# Patient Record
Sex: Female | Born: 1965 | Race: White | Hispanic: No | Smoking: Current every day smoker
Health system: Southern US, Community
[De-identification: ages and names within clinical notes are randomized; demographics above are authoritative.]

## PROBLEM LIST (undated history)

## (undated) HISTORY — PX: ORTHOPEDIC SURGERY: SHX850

---

## 2008-12-26 ENCOUNTER — Emergency Department (HOSPITAL_COMMUNITY): Admission: EM | Admit: 2008-12-26 | Discharge: 2008-12-26 | Payer: Self-pay | Admitting: Emergency Medicine

## 2009-01-04 ENCOUNTER — Other Ambulatory Visit: Admission: RE | Admit: 2009-01-04 | Discharge: 2009-01-04 | Payer: Self-pay | Admitting: Obstetrics & Gynecology

## 2010-08-27 ENCOUNTER — Institutional Professional Consult (permissible substitution) (INDEPENDENT_AMBULATORY_CARE_PROVIDER_SITE_OTHER): Payer: Self-pay | Admitting: Internal Medicine

## 2010-10-28 LAB — URINALYSIS, ROUTINE W REFLEX MICROSCOPIC
Bilirubin Urine: NEGATIVE
Glucose, UA: NEGATIVE mg/dL
Ketones, ur: 15 mg/dL — AB
Protein, ur: NEGATIVE mg/dL
pH: 5.5 (ref 5.0–8.0)

## 2010-10-28 LAB — CBC
HCT: 37.3 % (ref 36.0–46.0)
MCHC: 35.1 g/dL (ref 30.0–36.0)
MCV: 96.1 fL (ref 78.0–100.0)
Platelets: 185 10*3/uL (ref 150–400)

## 2010-10-28 LAB — DIFFERENTIAL
Basophils Relative: 1 % (ref 0–1)
Eosinophils Absolute: 0.3 10*3/uL (ref 0.0–0.7)
Eosinophils Relative: 4 % (ref 0–5)
Lymphs Abs: 3.3 10*3/uL (ref 0.7–4.0)
Monocytes Relative: 6 % (ref 3–12)
Neutrophils Relative %: 46 % (ref 43–77)

## 2010-10-28 LAB — WET PREP, GENITAL
WBC, Wet Prep HPF POC: NONE SEEN
Yeast Wet Prep HPF POC: NONE SEEN

## 2010-10-28 LAB — URINE MICROSCOPIC-ADD ON

## 2016-06-24 ENCOUNTER — Other Ambulatory Visit: Payer: Self-pay | Admitting: *Deleted

## 2016-06-24 MED ORDER — CITALOPRAM HYDROBROMIDE 20 MG PO TABS: 20.0000 mg | ORAL_TABLET | Freq: Every day | ORAL | 3 refills | Status: AC

## 2016-12-17 ENCOUNTER — Other Ambulatory Visit: Payer: Self-pay | Admitting: Physician Assistant

## 2017-02-10 ENCOUNTER — Ambulatory Visit (INDEPENDENT_AMBULATORY_CARE_PROVIDER_SITE_OTHER): Payer: BLUE CROSS/BLUE SHIELD | Admitting: Urology

## 2017-02-10 ENCOUNTER — Other Ambulatory Visit (HOSPITAL_COMMUNITY)
Admission: RE | Admit: 2017-02-10 | Discharge: 2017-02-10 | Disposition: A | Discharge status: Home / Self Care | Payer: BLUE CROSS/BLUE SHIELD | Source: Ambulatory Visit | Attending: Urology | Admitting: Urology

## 2017-02-10 DIAGNOSIS — R310 Gross hematuria: Secondary | ICD-10-CM

## 2017-02-13 ENCOUNTER — Other Ambulatory Visit: Payer: Self-pay | Admitting: Urology

## 2017-02-13 DIAGNOSIS — R310 Gross hematuria: Secondary | ICD-10-CM

## 2017-02-19 ENCOUNTER — Other Ambulatory Visit: Payer: Self-pay | Admitting: Physician Assistant

## 2017-02-20 ENCOUNTER — Encounter (HOSPITAL_COMMUNITY): Payer: Self-pay | Admitting: Radiology

## 2017-02-20 ENCOUNTER — Ambulatory Visit (HOSPITAL_COMMUNITY)
Admission: RE | Admit: 2017-02-20 | Discharge: 2017-02-20 | Disposition: A | Discharge status: Home / Self Care | Payer: BLUE CROSS/BLUE SHIELD | Source: Ambulatory Visit | Attending: Urology | Admitting: Urology

## 2017-02-20 DIAGNOSIS — N200 Calculus of kidney: Secondary | ICD-10-CM | POA: Insufficient documentation

## 2017-02-20 DIAGNOSIS — R310 Gross hematuria: Secondary | ICD-10-CM | POA: Insufficient documentation

## 2017-02-20 DIAGNOSIS — N281 Cyst of kidney, acquired: Secondary | ICD-10-CM | POA: Insufficient documentation

## 2017-02-20 DIAGNOSIS — D1809 Hemangioma of other sites: Secondary | ICD-10-CM | POA: Diagnosis not present

## 2017-02-20 DIAGNOSIS — I700 Atherosclerosis of aorta: Secondary | ICD-10-CM | POA: Diagnosis not present

## 2017-02-20 MED ORDER — IOPAMIDOL (ISOVUE-300) INJECTION 61%
125.0000 mL | Freq: Once | INTRAVENOUS | Status: AC | PRN
  Administered 2017-02-20: 125 mL via INTRAVENOUS

## 2017-02-20 MED ORDER — SODIUM CHLORIDE 0.9 % IV SOLN
INTRAVENOUS | Status: AC
  Filled 2017-02-20: qty 250

## 2017-06-03 ENCOUNTER — Ambulatory Visit: Payer: BLUE CROSS/BLUE SHIELD | Admitting: Urology

## 2018-10-08 ENCOUNTER — Ambulatory Visit: Payer: Self-pay

## 2019-01-17 IMAGING — CT CT ABD-PEL WO/W CM
2 of 11 series · 10 of 46 positions shown, 16 images · IV contrast (Isovue)
Comparison: None.

CLINICAL DATA: Intermittent hematuria, painless x4 months

EXAM:
CT ABDOMEN AND PELVIS WITHOUT AND WITH CONTRAST
TECHNIQUE: Multidetector CT imaging of the abdomen and pelvis was performed
following the standard protocol before and following the bolus
administration of intravenous contrast.
CONTRAST:  125mL [id number] IOPAMIDOL ([id number]-CFF) INJECTION 61%

[Series 5: axial delay · axial · delayed · 0.71mm/px · z∈[+718,+1068]mm · 8 of 90 slices shown, 13 images]
[im 10/90  soft-tissue]
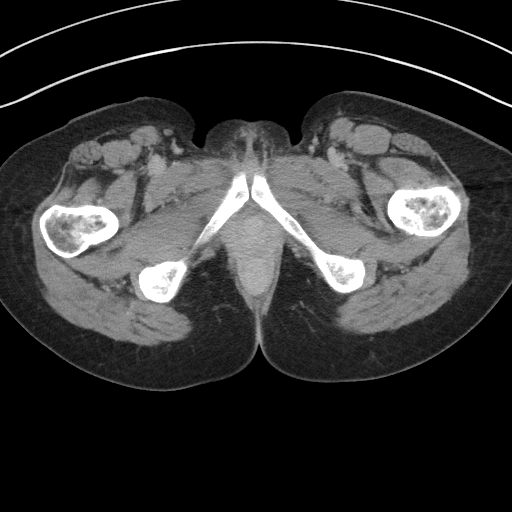
[im 10/90  bone]
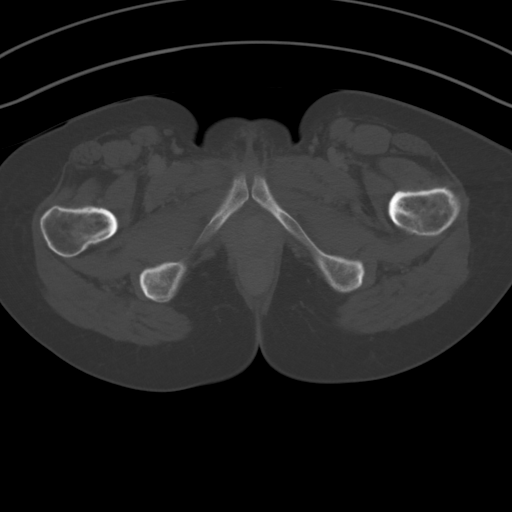
[im 20/90  soft-tissue]
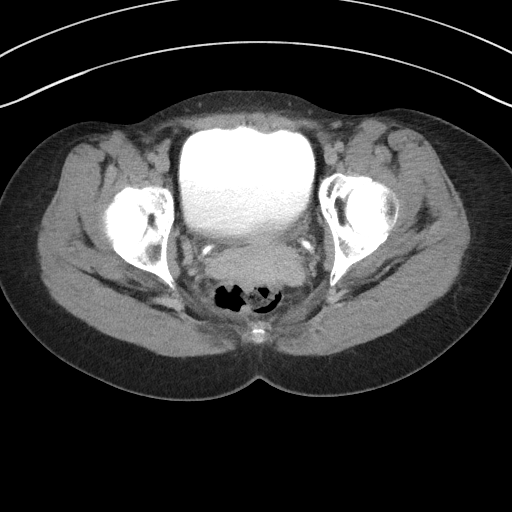
[im 30/90  soft-tissue]
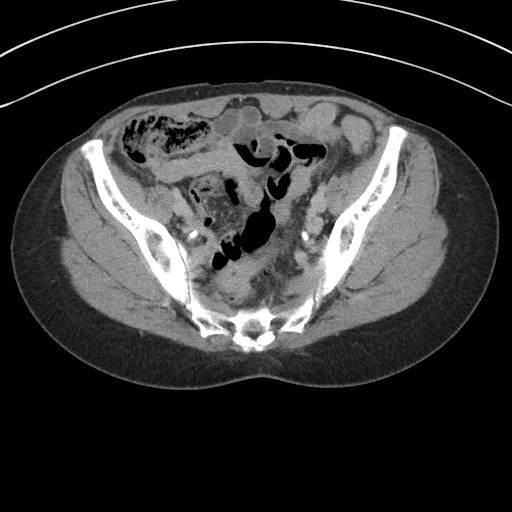
[im 40/90  soft-tissue]
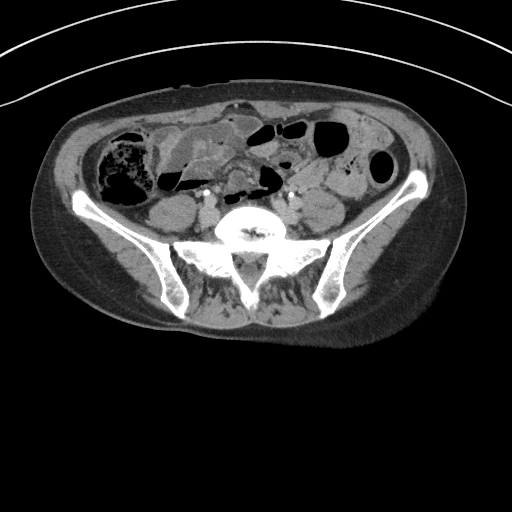
[im 50/90  soft-tissue]
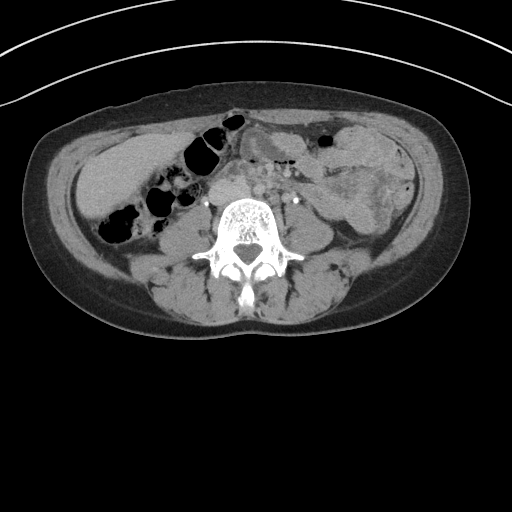
[im 50/90  lung]
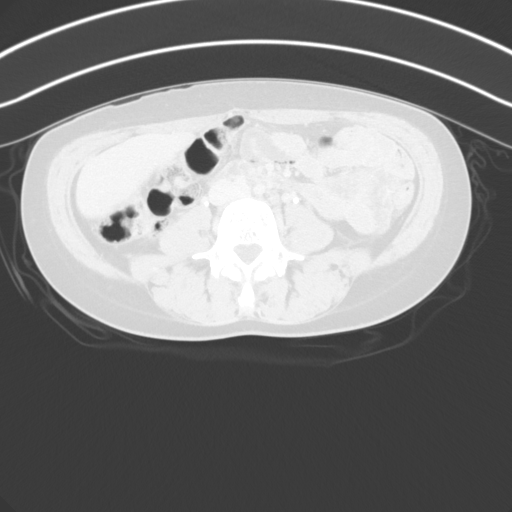
[im 60/90  soft-tissue]
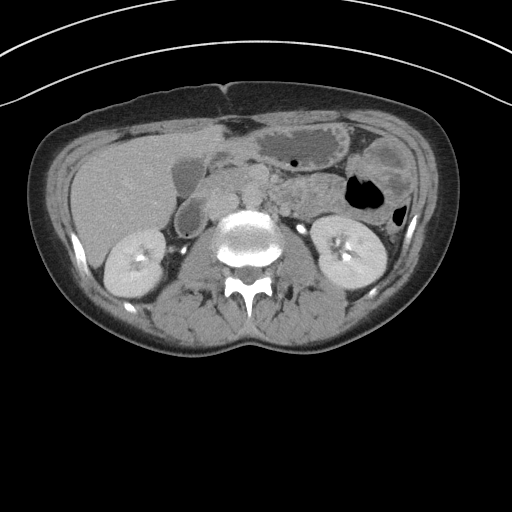
[im 60/90  lung]
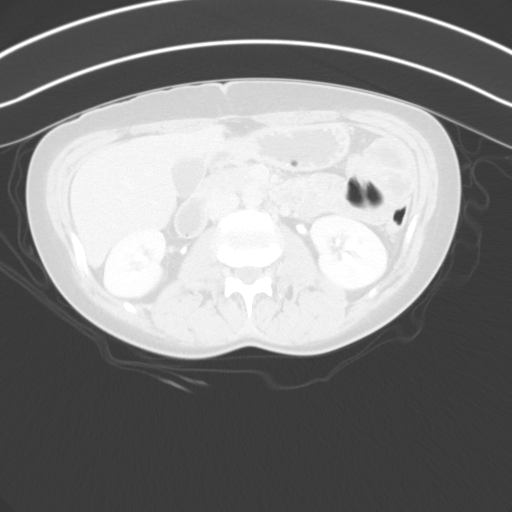
[im 70/90  soft-tissue]
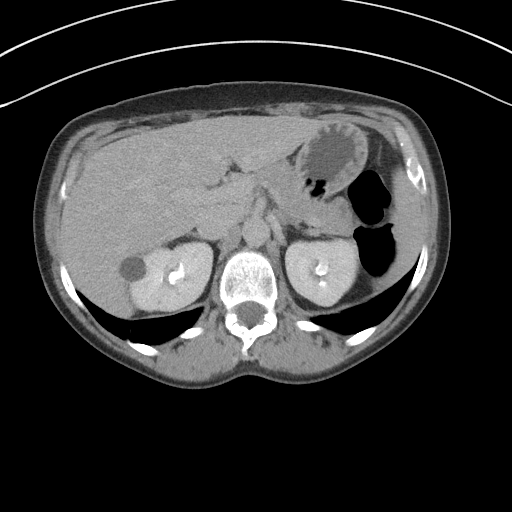
[im 70/90  lung]
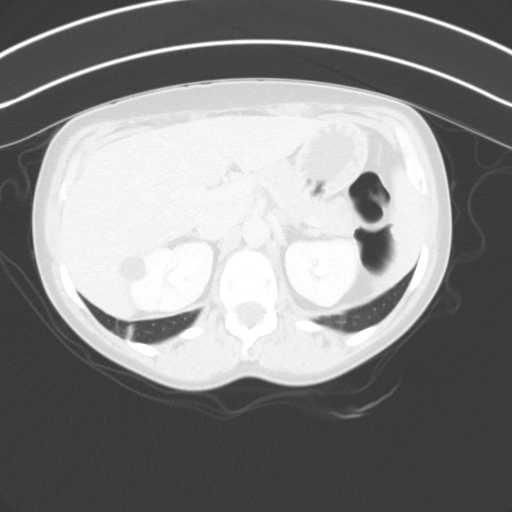
[im 80/90  soft-tissue]
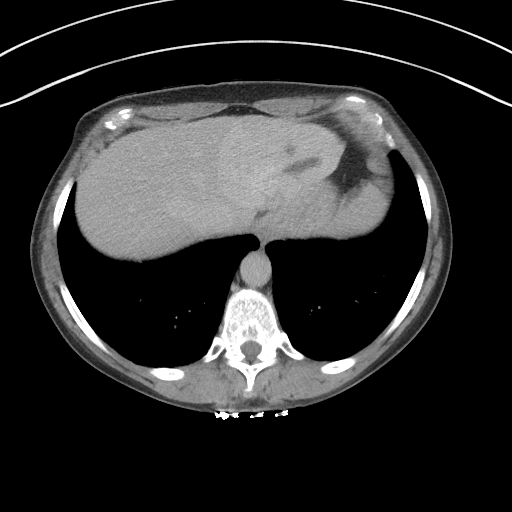
[im 80/90  lung]
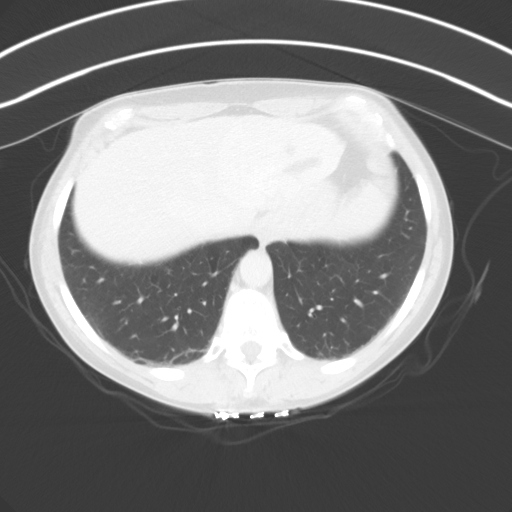

[Series 12: coronal pre · coronal · non-contrast · 0.65mm/px · 2 of 93 slices shown, 3 images]
[im 31/93  soft-tissue]
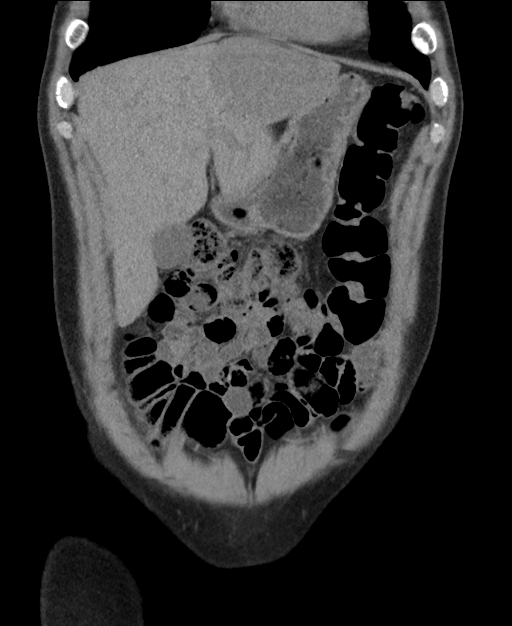
[im 31/93  bone]
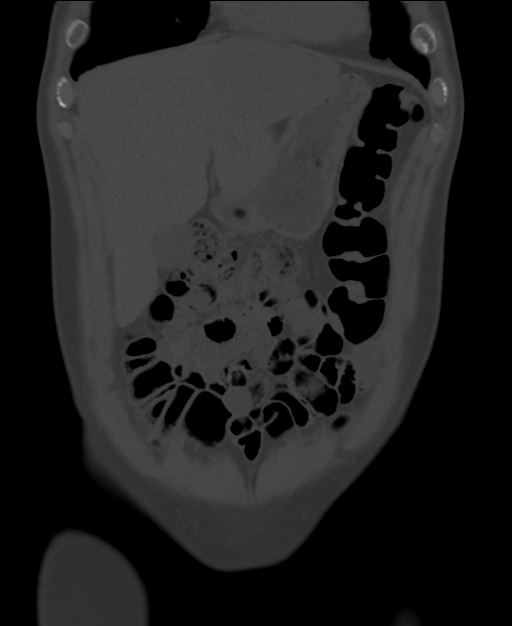
[im 62/93  soft-tissue]
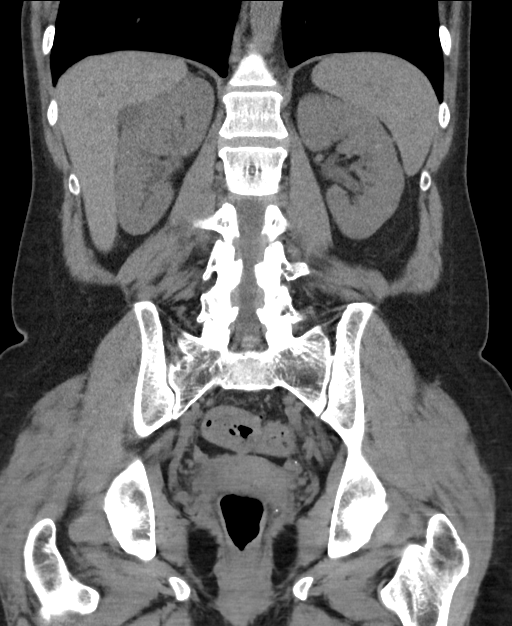

[10 of 46 positions shown; findings below may reference images not displayed]

FINDINGS: Lower chest: Lung bases are clear.

Hepatobiliary: 8.7 cm cavernous hemangioma in the lateral segment
left hepatic lobe (series 4/image 10).

Gallbladder is unremarkable. No intrahepatic or extrahepatic ductal
dilatation.

Pancreas: Within normal limits.

Spleen: Within normal limits.

Adrenals/Urinary Tract: Adrenal glands are within normal limits.

1.8 cm lateral right upper pole renal cyst (series 4/ image 17). No
enhancing renal lesions.

5 mm nonobstructing left lower pole renal calculus (series 3/ image
26). No ureteral or bladder calculi. No hydronephrosis.

Mildly thick-walled bladder.

Stomach/Bowel: Stomach is within normal limits.

No evidence of bowel obstruction.

Normal appendix (series 4/ image 60).

Vascular/Lymphatic: No evidence of abdominal aortic aneurysm.

Mild atherosclerotic calcifications the abdominal aorta.

No suspicious abdominopelvic lymphadenopathy.

Reproductive: Uterus is within normal limits.

No adnexal masses.

Other: No abdominopelvic ascites.

Musculoskeletal: Visualized osseous structures are within normal
limits.
IMPRESSION: 5 mm nonobstructing left lower pole renal calculus. No ureteral or
bladder calculi. No hydronephrosis.

1.8 cm right lateral right upper pole renal cyst, benign (Bosniak
I). No enhancing renal lesions.

8.7 cm cavernous hemangioma in the lateral segment left hepatic
lobe, benign.

## 2019-07-27 ENCOUNTER — Other Ambulatory Visit: Payer: Self-pay

## 2019-07-27 ENCOUNTER — Ambulatory Visit: Payer: Self-pay | Attending: Internal Medicine

## 2019-07-27 DIAGNOSIS — Z20822 Contact with and (suspected) exposure to covid-19: Secondary | ICD-10-CM | POA: Insufficient documentation

## 2019-07-28 ENCOUNTER — Telehealth: Payer: Self-pay

## 2019-07-28 LAB — NOVEL CORONAVIRUS, NAA: SARS-CoV-2, NAA: NOT DETECTED

## 2019-07-28 NOTE — Telephone Encounter (Signed)
Pt. Checking on COVID 19 results, not available yet. °

## 2019-11-19 ENCOUNTER — Encounter (HOSPITAL_COMMUNITY): Payer: Self-pay | Admitting: Emergency Medicine

## 2019-11-19 ENCOUNTER — Other Ambulatory Visit: Payer: Self-pay

## 2019-11-19 ENCOUNTER — Emergency Department (HOSPITAL_COMMUNITY)
Admission: EM | Admit: 2019-11-19 | Discharge: 2019-11-19 | Disposition: A | Discharge status: Home / Self Care | Payer: Self-pay | Attending: Emergency Medicine | Admitting: Emergency Medicine

## 2019-11-19 ENCOUNTER — Emergency Department (HOSPITAL_COMMUNITY): Payer: Self-pay

## 2019-11-19 DIAGNOSIS — R112 Nausea with vomiting, unspecified: Secondary | ICD-10-CM | POA: Insufficient documentation

## 2019-11-19 DIAGNOSIS — Z79899 Other long term (current) drug therapy: Secondary | ICD-10-CM | POA: Insufficient documentation

## 2019-11-19 DIAGNOSIS — R079 Chest pain, unspecified: Secondary | ICD-10-CM | POA: Insufficient documentation

## 2019-11-19 DIAGNOSIS — F17210 Nicotine dependence, cigarettes, uncomplicated: Secondary | ICD-10-CM | POA: Insufficient documentation

## 2019-11-19 DIAGNOSIS — F1210 Cannabis abuse, uncomplicated: Secondary | ICD-10-CM | POA: Insufficient documentation

## 2019-11-19 DIAGNOSIS — N3001 Acute cystitis with hematuria: Secondary | ICD-10-CM | POA: Insufficient documentation

## 2019-11-19 LAB — URINALYSIS, ROUTINE W REFLEX MICROSCOPIC
Bilirubin Urine: NEGATIVE
Glucose, UA: NEGATIVE mg/dL
Ketones, ur: 5 mg/dL — AB
Nitrite: NEGATIVE
Protein, ur: 30 mg/dL — AB
Specific Gravity, Urine: 1.021 (ref 1.005–1.030)
pH: 5 (ref 5.0–8.0)

## 2019-11-19 LAB — COMPREHENSIVE METABOLIC PANEL
ALT: 22 U/L (ref 0–44)
AST: 25 U/L (ref 15–41)
Albumin: 4.6 g/dL (ref 3.5–5.0)
Alkaline Phosphatase: 83 U/L (ref 38–126)
Anion gap: 13 (ref 5–15)
BUN: 17 mg/dL (ref 6–20)
CO2: 24 mmol/L (ref 22–32)
Calcium: 9.4 mg/dL (ref 8.9–10.3)
Chloride: 102 mmol/L (ref 98–111)
Creatinine, Ser: 0.73 mg/dL (ref 0.44–1.00)
GFR calc Af Amer: >60 mL/min (ref >60)
GFR calc non Af Amer: >60 mL/min (ref >60)
Glucose, Bld: 100 mg/dL — ABNORMAL HIGH (ref 70–99)
Potassium: 3.6 mmol/L (ref 3.5–5.1)
Sodium: 139 mmol/L (ref 135–145)
Total Bilirubin: 0.5 mg/dL (ref 0.3–1.2)
Total Protein: 7.7 g/dL (ref 6.5–8.1)

## 2019-11-19 LAB — POC OCCULT BLOOD, ED: Fecal Occult Bld: NEGATIVE

## 2019-11-19 LAB — TROPONIN I (HIGH SENSITIVITY)
Troponin I (High Sensitivity): 3 ng/L (ref <18)
Troponin I (High Sensitivity): 3 ng/L (ref <18)

## 2019-11-19 LAB — CBC WITH DIFFERENTIAL/PLATELET
Abs Immature Granulocytes: 0.03 10*3/uL (ref 0.00–0.07)
Basophils Absolute: 0 10*3/uL (ref 0.0–0.1)
Basophils Relative: 0 %
Eosinophils Absolute: 0.2 10*3/uL (ref 0.0–0.5)
Eosinophils Relative: 1 %
HCT: 40.2 % (ref 36.0–46.0)
Hemoglobin: 13.6 g/dL (ref 12.0–15.0)
Immature Granulocytes: 0 %
Lymphocytes Relative: 39 %
Lymphs Abs: 4.3 10*3/uL — ABNORMAL HIGH (ref 0.7–4.0)
MCH: 32.7 pg (ref 26.0–34.0)
MCHC: 33.8 g/dL (ref 30.0–36.0)
MCV: 96.6 fL (ref 80.0–100.0)
Monocytes Absolute: 0.6 10*3/uL (ref 0.1–1.0)
Monocytes Relative: 6 %
Neutro Abs: 5.8 10*3/uL (ref 1.7–7.7)
Neutrophils Relative %: 54 %
Platelets: 286 10*3/uL (ref 150–400)
RBC: 4.16 MIL/uL (ref 3.87–5.11)
RDW: 12.8 % (ref 11.5–15.5)
WBC: 10.9 10*3/uL — ABNORMAL HIGH (ref 4.0–10.5)
nRBC: 0 % (ref 0.0–0.2)

## 2019-11-19 LAB — RAPID URINE DRUG SCREEN, HOSP PERFORMED
Amphetamines: NOT DETECTED
Barbiturates: NOT DETECTED
Benzodiazepines: NOT DETECTED
Cocaine: NOT DETECTED
Opiates: NOT DETECTED
Tetrahydrocannabinol: POSITIVE — AB

## 2019-11-19 LAB — LIPASE, BLOOD: Lipase: 32 U/L (ref 11–51)

## 2019-11-19 MED ORDER — ONDANSETRON HCL 4 MG/2ML IJ SOLN
4.0000 mg | Freq: Once | INTRAMUSCULAR | Status: AC
  Administered 2019-11-19: 4 mg via INTRAVENOUS
  Filled 2019-11-19: qty 2

## 2019-11-19 MED ORDER — ONDANSETRON 4 MG PO TBDP
4.0000 mg | ORAL_TABLET | Freq: Three times a day (TID) | ORAL | 0 refills | Status: AC | PRN
Start: 2019-11-19 — End: ?

## 2019-11-19 MED ORDER — LORAZEPAM 2 MG/ML IJ SOLN
1.0000 mg | Freq: Once | INTRAMUSCULAR | Status: AC
  Administered 2019-11-19: 1 mg via INTRAVENOUS
  Filled 2019-11-19: qty 1

## 2019-11-19 MED ORDER — PROMETHAZINE HCL 25 MG/ML IJ SOLN
12.5000 mg | Freq: Once | INTRAMUSCULAR | Status: AC
  Administered 2019-11-19: 15:00:00 12.5 mg via INTRAVENOUS
  Filled 2019-11-19: qty 1

## 2019-11-19 MED ORDER — SODIUM CHLORIDE 0.9 % IV BOLUS
1000.0000 mL | Freq: Once | INTRAVENOUS | Status: AC
  Administered 2019-11-19: 1000 mL via INTRAVENOUS

## 2019-11-19 MED ORDER — PANTOPRAZOLE SODIUM 40 MG IV SOLR
40.0000 mg | Freq: Once | INTRAVENOUS | Status: AC
  Administered 2019-11-19: 40 mg via INTRAVENOUS
  Filled 2019-11-19: qty 40

## 2019-11-19 MED ORDER — CEPHALEXIN 500 MG PO CAPS
500.0000 mg | ORAL_CAPSULE | Freq: Three times a day (TID) | ORAL | 0 refills | Status: AC
Start: 2019-11-19 — End: 2019-11-24

## 2019-11-19 NOTE — Discharge Instructions (Signed)
Please take the Zofran ODT, as needed for symptoms of nausea.  It dissolves underneath your tongue do not need to swallow it.  Please drink plenty of fluids to replace those lost through your vomiting.  I have also prescribed you Keflex for your small UTI.  Please discontinue marijuana as this may have contributed to your uncontrolled nausea and vomiting symptoms.  I also encourage you to discontinue your marijuana and alcohol use.  I'm very sorry for your loss, but there needs to be a healthier coping method.  You were given Ativan and or sedative medications while in the emergency department. Do not drive. Do not use machinery or power tools. Do not sign legal documents. Do not drink alcohol. Do not take sleeping pills. Do not supervise children by yourself. Do not participate in activities that require climbing or being in high places.

## 2019-11-19 NOTE — ED Notes (Signed)
Water and crackers given for po challenge.  

## 2019-11-19 NOTE — ED Notes (Signed)
Patient appears disoriented at this time and stats that she is sleepy. Patient encouraged to drink and eat.

## 2019-11-19 NOTE — ED Notes (Signed)
Pt up and down to the bedside comode, pt has voided cloudy yellow urine.  Pt continues to have some dry heaves and c/o abd pain/ epigastric pain, PA at bedside, meds ordered and admin.  Pt states that she has had similar symptoms with a uti.  Pt remains on cardiac monitor in sinus rhythm.

## 2019-11-19 NOTE — ED Triage Notes (Signed)
Cp x 4 days, dizziness, n/v

## 2019-11-19 NOTE — ED Notes (Signed)
Pt in bed with eyes closed, pt arouses easily to verbal stim, pt denies nausea at this time, 2L O2 placed via [place] secondary to decreased O2 sat, pt now satting high 90s to 100%.

## 2019-11-19 NOTE — ED Provider Notes (Addendum)
[facility] Provider Note   CSN: [id number] Arrival date & time: 11/19/19  1316     History Chief Complaint  Patient presents with  . Chest Pain    Whitney Luna is a 54 y.o. female with past medical history significant for chronic pain syndrome on meloxicam daily and anxiety on citalopram who presents to the ED with a 3-day history of intermittent substernal chest and epigastric discomfort in addition to a 1 day history of unrelenting nausea and vomiting with associated shortness of breath.  Patient also states that she feels lightheaded as if she was going to pass out.  She feels full body tingling sensation and states that she has been having loose stools for a couple of days.  She denies any fevers but endorses chills.   She is unsure as to whether or not her emesis has been BRB/coffee grounds or if she has had melena/hematochezia.  She has been drinking more alcohol recently than usual this month due to her mother passing away.  She also endorses smoking marijuana regularly, however denies any other illicit drug use.  HPI     History reviewed. No pertinent past medical history.  There are no problems to display for this patient.   Past Surgical History:  Procedure Laterality Date  . ORTHOPEDIC SURGERY       OB History   No obstetric history on file.     No family history on file.  Social History   Tobacco Use  . Smoking status: Current Every Day Smoker    Packs/day: 0.50    Types: Cigarettes  . Smokeless tobacco: Never Used  Substance Use Topics  . Alcohol use: Yes    Comment: occ  . Drug use: Yes    Types: Marijuana    Comment: occ    Home Medications Prior to Admission medications   Medication Sig Start Date End Date Taking? Authorizing Provider  citalopram (CELEXA) 20 MG tablet Take 1 tablet (20 mg total) by mouth daily. 06/24/16  Yes Terald Sleeper, PA-C  meloxicam (MOBIC) 7.5 MG tablet Take 7.5 mg by mouth daily. 08/16/19  Yes  [provider]  cephALEXin (KEFLEX) 500 MG capsule Take 1 capsule (500 mg total) by mouth 3 (three) times daily for 5 days. 11/19/19 11/24/19  Corena Herter, PA-C  ondansetron (ZOFRAN ODT) 4 MG disintegrating tablet Take 1 tablet (4 mg total) by mouth every 8 (eight) hours as needed for nausea or vomiting. 11/19/19   Corena Herter, PA-C    Allergies    Patient has no known allergies.  Review of Systems   Review of Systems  All other systems reviewed and are negative.   Physical Exam Updated Vital Signs BP 119/65   Pulse 83   Temp 98.1 F (36.7 C) (Oral)   Resp (!) 23   Ht 5\' 2"  (1.575 m)   Wt 55.3 kg   SpO2 100%   BMI 22.31 kg/m   Physical Exam Vitals and nursing note reviewed. Exam conducted with a chaperone present.  Constitutional:      Appearance: Normal appearance.  HENT:     Head: Normocephalic and atraumatic.  Eyes:     General: No scleral icterus.    Conjunctiva/sclera: Conjunctivae normal.  Cardiovascular:     Rate and Rhythm: Normal rate and regular rhythm.     Pulses: Normal pulses.     Heart sounds: Normal heart sounds.  Pulmonary:     Effort: Pulmonary effort is  normal. No respiratory distress.     Breath sounds: Normal breath sounds. No wheezing or rales.  Abdominal:     Comments: Soft, nondistended.  No significant TTP.  No guarding.  No overlying skin changes.  Normoactive bowel sounds.  Musculoskeletal:     Cervical back: Normal range of motion. No rigidity.  Skin:    General: Skin is dry.  Neurological:     Mental Status: She is alert and oriented to person, place, and time.     GCS: GCS eye subscore is 4. GCS verbal subscore is 5. GCS motor subscore is 6.  Psychiatric:        Thought Content: Thought content normal.     Comments: Fatigued.     ED Results / Procedures / Treatments   Labs (all labs ordered are listed, but only abnormal results are displayed) Labs Reviewed  COMPREHENSIVE METABOLIC PANEL - Abnormal; Notable for the  following components:      Result Value   Glucose, Bld 100 (*)    All other components within normal limits  CBC WITH DIFFERENTIAL/PLATELET - Abnormal; Notable for the following components:   WBC 10.9 (*)    Lymphs Abs 4.3 (*)    All other components within normal limits  URINALYSIS, ROUTINE W REFLEX MICROSCOPIC - Abnormal; Notable for the following components:   APPearance HAZY (*)    Hgb urine dipstick SMALL (*)    Ketones, ur 5 (*)    Protein, ur 30 (*)    Leukocytes,Ua MODERATE (*)    Bacteria, UA RARE (*)    All other components within normal limits  RAPID URINE DRUG SCREEN, HOSP PERFORMED - Abnormal; Notable for the following components:   Tetrahydrocannabinol POSITIVE (*)    All other components within normal limits  LIPASE, BLOOD  OCCULT BLOOD X 1 CARD TO LAB, STOOL  POC OCCULT BLOOD, ED  TROPONIN I (HIGH SENSITIVITY)  TROPONIN I (HIGH SENSITIVITY)    EKG EKG Interpretation  Date/Time:  Saturday Nov 19 2019 17:50:22 EDT Ventricular Rate:  81 PR Interval:    QRS Duration: 93 QT Interval:  401 QTC Calculation: 466 R Axis:   83 Text Interpretation: Sinus rhythm No STEMI Confirmed by Nanda Quinton [phone]) on 11/19/2019 6:12:07 PM   Radiology DG Chest Portable 1 View  Result Date: 11/19/2019 CLINICAL DATA:  Chest pain. Nausea and vomiting. Dizziness. EXAM: PORTABLE CHEST 1 VIEW COMPARISON:  10/13/2016 FINDINGS: The cardiomediastinal contours are normal. The lungs are clear. Pulmonary vasculature is normal. No consolidation, pleural effusion, or pneumothorax. No acute osseous abnormalities are seen. IMPRESSION: No acute chest findings. Electronically Signed   By: Keith Rake M.D.   On: 11/19/2019 15:16    Procedures Procedures (including critical care time)  Medications Ordered in ED Medications  sodium chloride 0.9 % bolus 1,000 mL (0 mLs Intravenous Stopped 11/19/19 1523)  ondansetron (ZOFRAN) injection 4 mg (4 mg Intravenous Given 11/19/19 1422)  pantoprazole  (PROTONIX) injection 40 mg (40 mg Intravenous Given 11/19/19 1423)  promethazine (PHENERGAN) injection 12.5 mg (12.5 mg Intravenous Given 11/19/19 1526)  LORazepam (ATIVAN) injection 1 mg (1 mg Intravenous Given 11/19/19 1524)  sodium chloride 0.9 % bolus 1,000 mL (0 mLs Intravenous Stopped 11/19/19 1639)  ondansetron (ZOFRAN) injection 4 mg (4 mg Intravenous Given 11/19/19 2001)    ED Course  I have reviewed the triage vital signs and the nursing notes.  Pertinent labs & imaging results that were available during my care of the patient were reviewed by me  and considered in my medical decision making (see chart for details).    MDM Rules/Calculators/A&P                      We will obtain fecal occult study.  Her history of chronic NSAIDs and SSRI in conjunction with tobacco use and alcohol use in setting of substernal nonradiating chest pain and epigastric pain is concerning for PUD.  Will provide 2 L IV NS in addition to Zofran and Protonix.  Abdominal exam is benign and no focal TTP.   On reexamination, patient still complaining of discomfort.  Questionable alcohol withdrawal/cannabinoid induced hyperemesis syndrome.  Will provide Ativan 1 mg in addition to 12.5 mg Phenergan IV.  After several hours of observation in the ER, patient has not vomited since receiving the Ativan and Phenergan.  She was resting comfortably, sleeping in the bed.  Her cardiac work-up was unremarkable.  CMP demonstrates no significant derangement.  CBC shows a mild leukocytosis to 10.9, but no other abnormalities.  Fecal occult study was negative.  EKG is reassuring and did not demonstrate evidence concerning for ischemia.  Troponin was repeated and WNL.  We will p.o. challenge patient.  If she can eat and drink, safe for discharge with plans to follow-up with her primary care provider in outpatient setting.  Strongly encourage patient to discontinue engaging in alcohol and marijuana use as a way to cope with her mother's  demise.  Patient denies any SI/HI/AVH.  Do not feel as though TTS consult warranted.  Encouraging her to discuss her mental health further with her primary care provider.  Patient was able to tolerate water without difficulty but does not want crackers.  Will send home with Zofran ODT.  Patient does not appear to be safe candidate to drive home since being given Ativan and Phenergan here in the ED.  Discharge pending while we find somebody who can drive her.  She still has not vomited since receiving Ativan and Phenergan many hours later and is feeling improved, although still reluctant to eat.  Discussed with Dr. Laverta Baltimore who agrees with assessment and plan.  Strict ED return precautions discussed.  All of the evaluation and work-up results were discussed with the patient and any family at bedside. They were provided opportunity to ask any additional questions and have none at this time. They have expressed understanding of verbal discharge instructions as well as return precautions and are agreeable to the plan.    Final Clinical Impression(s) / ED Diagnoses Final diagnoses:  Nonspecific chest pain  Non-intractable vomiting with nausea, unspecified vomiting type  Acute cystitis with hematuria    Rx / DC Orders ED Discharge Orders         Ordered    ondansetron (ZOFRAN ODT) 4 MG disintegrating tablet  Every 8 hours PRN     11/19/19 1942    cephALEXin (KEFLEX) 500 MG capsule  3 times daily     11/19/19 1948           Corena Herter, PA-C 11/19/19 1947    Corena Herter, PA-C 11/19/19 2159    Corena Herter, PA-C 11/19/19 2205    Fredia Sorrow, MD 11/20/19 [phone]

## 2020-11-13 ENCOUNTER — Emergency Department (HOSPITAL_COMMUNITY)
Admission: EM | Admit: 2020-11-13 | Discharge: 2020-11-13 | Discharge status: Against Medical Advice | Payer: BC Managed Care – PPO | Attending: Emergency Medicine | Admitting: Emergency Medicine

## 2020-11-13 ENCOUNTER — Other Ambulatory Visit: Payer: Self-pay

## 2020-11-13 ENCOUNTER — Encounter (HOSPITAL_COMMUNITY): Payer: Self-pay | Admitting: Emergency Medicine

## 2020-11-13 DIAGNOSIS — Z5321 Procedure and treatment not carried out due to patient leaving prior to being seen by health care provider: Secondary | ICD-10-CM | POA: Insufficient documentation

## 2020-11-13 DIAGNOSIS — R55 Syncope and collapse: Secondary | ICD-10-CM | POA: Insufficient documentation

## 2020-11-13 DIAGNOSIS — R569 Unspecified convulsions: Secondary | ICD-10-CM | POA: Diagnosis not present

## 2020-11-13 LAB — ETHANOL: Alcohol, Ethyl (B): 53 mg/dL — ABNORMAL HIGH (ref <10)

## 2020-11-13 NOTE — ED Notes (Signed)
RN called to room. Pt stated that she wanted to leave. I explained to pt the risks of leaving AMA. Pt verbalized understanding. Pt ambulatory to [facility]

## 2020-11-13 NOTE — ED Triage Notes (Addendum)
[facility] - pt was sitting on her porch when she lost consciousness and had seizure like activity per the family. Pt was pale and lethargic upon ems arrival. Pt has received 1.5L of NS. Last BP was 122/87. ETOH on board. Pt ambulatory to [facility], stating she is ready to go home

## 2021-10-15 IMAGING — DX DG CHEST 1V PORT
1 series · 1 of 1 positions shown · non-contrast
Comparison: 10/13/2016

CLINICAL DATA: Chest pain. Nausea and vomiting. Dizziness.

EXAM:
PORTABLE CHEST 1 VIEW

[chest ap]
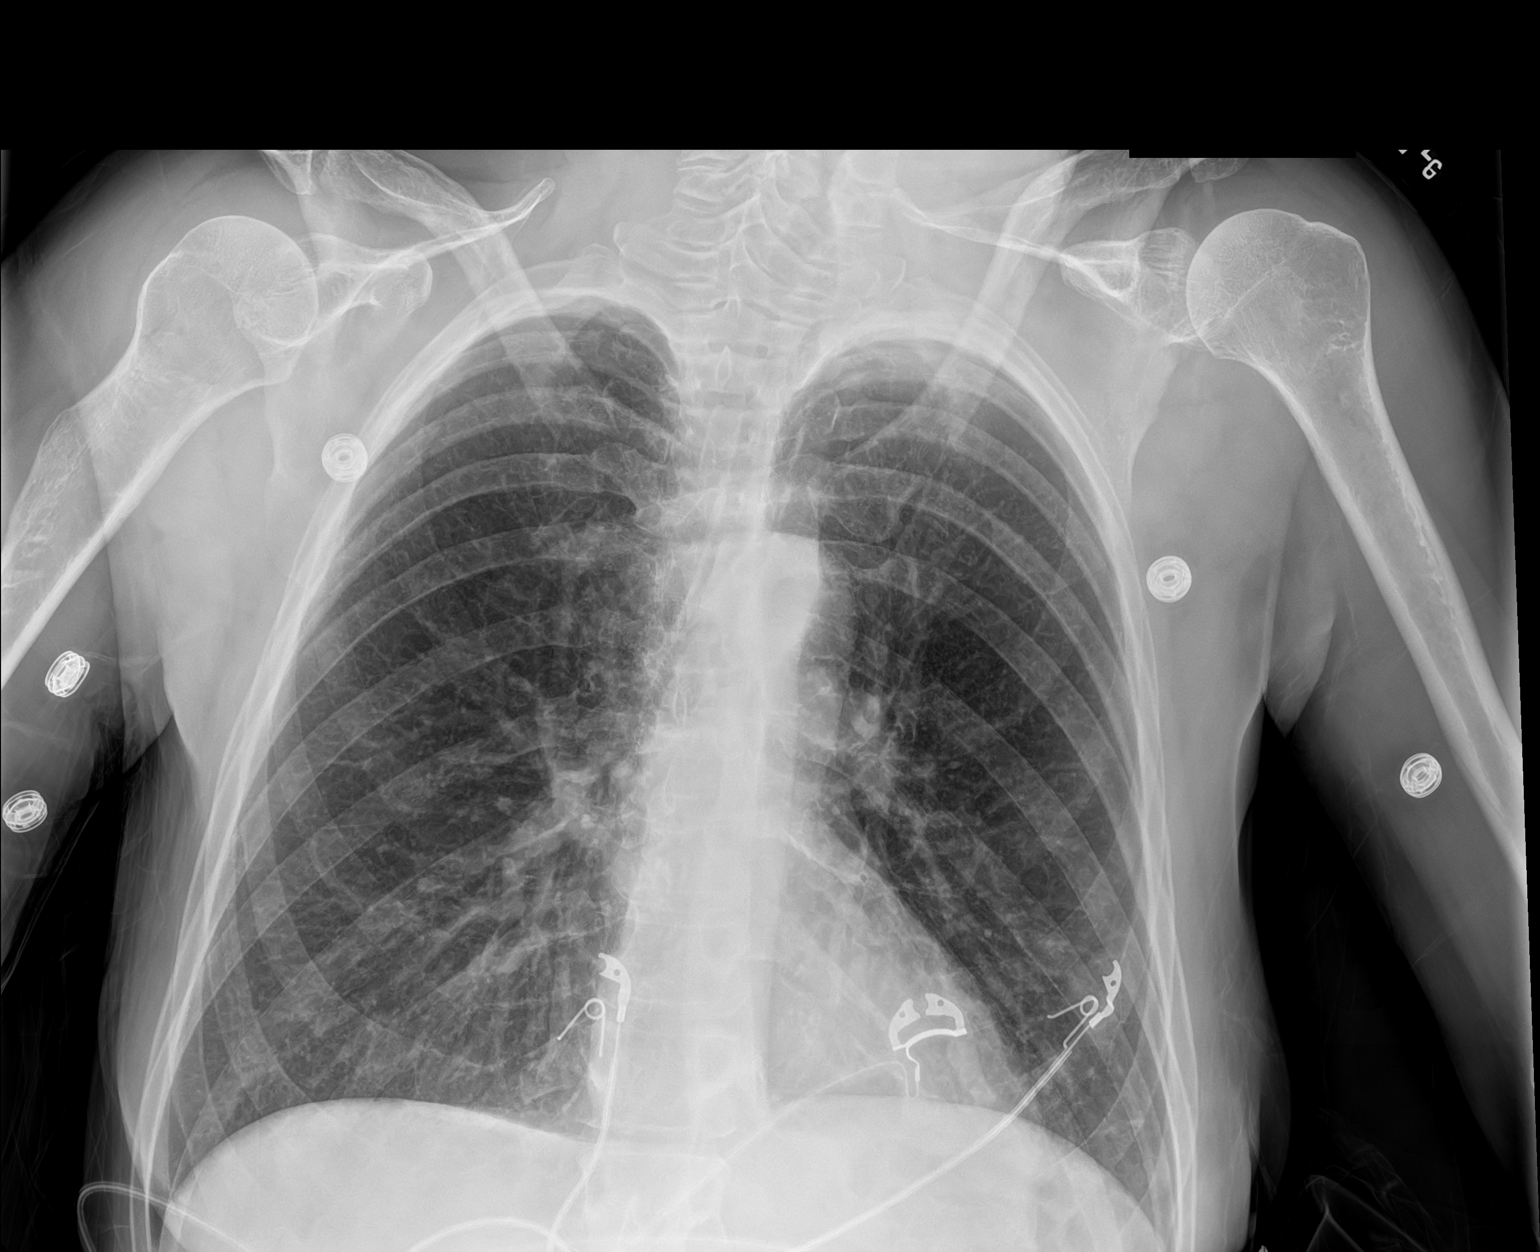

[1 of 1 positions shown; findings below may reference images not displayed]

FINDINGS: The cardiomediastinal contours are normal. The lungs are clear.
Pulmonary vasculature is normal. No consolidation, pleural effusion,
or pneumothorax. No acute osseous abnormalities are seen.
IMPRESSION: No acute chest findings.

## 2022-04-02 ENCOUNTER — Emergency Department (HOSPITAL_COMMUNITY)
Admission: EM | Admit: 2022-04-02 | Discharge: 2022-04-02 | Disposition: A | Discharge status: Home / Self Care | Payer: BC Managed Care – PPO | Attending: Emergency Medicine | Admitting: Emergency Medicine

## 2022-04-02 ENCOUNTER — Other Ambulatory Visit: Payer: Self-pay

## 2022-04-02 ENCOUNTER — Encounter (HOSPITAL_COMMUNITY): Payer: Self-pay | Admitting: *Deleted

## 2022-04-02 DIAGNOSIS — R1084 Generalized abdominal pain: Secondary | ICD-10-CM | POA: Insufficient documentation

## 2022-04-02 DIAGNOSIS — R197 Diarrhea, unspecified: Secondary | ICD-10-CM | POA: Insufficient documentation

## 2022-04-02 DIAGNOSIS — R5383 Other fatigue: Secondary | ICD-10-CM | POA: Insufficient documentation

## 2022-04-02 DIAGNOSIS — R112 Nausea with vomiting, unspecified: Secondary | ICD-10-CM | POA: Diagnosis present

## 2022-04-02 LAB — COMPREHENSIVE METABOLIC PANEL
ALT: 18 U/L (ref 0–44)
AST: 21 U/L (ref 15–41)
Albumin: 4.6 g/dL (ref 3.5–5.0)
Alkaline Phosphatase: 84 U/L (ref 38–126)
Anion gap: 14 (ref 5–15)
BUN: 9 mg/dL (ref 6–20)
CO2: 24 mmol/L (ref 22–32)
Calcium: 9.7 mg/dL (ref 8.9–10.3)
Chloride: 105 mmol/L (ref 98–111)
Creatinine, Ser: 0.62 mg/dL (ref 0.44–1.00)
GFR, Estimated: >60 mL/min (ref >60)
Glucose, Bld: 162 mg/dL — ABNORMAL HIGH (ref 70–99)
Potassium: 3.7 mmol/L (ref 3.5–5.1)
Sodium: 143 mmol/L (ref 135–145)
Total Bilirubin: 0.6 mg/dL (ref 0.3–1.2)
Total Protein: 8 g/dL (ref 6.5–8.1)

## 2022-04-02 LAB — CBC
HCT: 51.4 % — ABNORMAL HIGH (ref 36.0–46.0)
Hemoglobin: 17.4 g/dL — ABNORMAL HIGH (ref 12.0–15.0)
MCH: 33 pg (ref 26.0–34.0)
MCHC: 33.9 g/dL (ref 30.0–36.0)
MCV: 97.5 fL (ref 80.0–100.0)
Platelets: 280 10*3/uL (ref 150–400)
RBC: 5.27 MIL/uL — ABNORMAL HIGH (ref 3.87–5.11)
RDW: 13.2 % (ref 11.5–15.5)
WBC: 10.2 10*3/uL (ref 4.0–10.5)
nRBC: 0 % (ref 0.0–0.2)

## 2022-04-02 LAB — URINALYSIS, ROUTINE W REFLEX MICROSCOPIC
Bilirubin Urine: NEGATIVE
Glucose, UA: 50 mg/dL — AB
Ketones, ur: 5 mg/dL — AB
Leukocytes,Ua: NEGATIVE
Nitrite: NEGATIVE
Protein, ur: 100 mg/dL — AB
RBC / HPF: >50 RBC/hpf — ABNORMAL HIGH (ref 0–5)
Specific Gravity, Urine: 1.019 (ref 1.005–1.030)
pH: 7 (ref 5.0–8.0)

## 2022-04-02 LAB — LIPASE, BLOOD: Lipase: 23 U/L (ref 11–51)

## 2022-04-02 MED ORDER — SODIUM CHLORIDE 0.9 % IV BOLUS
1000.0000 mL | Freq: Once | INTRAVENOUS | Status: AC
  Administered 2022-04-02: 1000 mL via INTRAVENOUS

## 2022-04-02 MED ORDER — CEPHALEXIN 500 MG PO CAPS
500.0000 mg | ORAL_CAPSULE | Freq: Once | ORAL | Status: AC
  Administered 2022-04-02: 500 mg via ORAL
  Filled 2022-04-02: qty 1

## 2022-04-02 MED ORDER — CEPHALEXIN 500 MG PO CAPS: 500.0000 mg | ORAL_CAPSULE | Freq: Four times a day (QID) | ORAL | 0 refills | Status: AC

## 2022-04-02 MED ORDER — FAMOTIDINE 20 MG PO TABS: 20.0000 mg | ORAL_TABLET | Freq: Two times a day (BID) | ORAL | 0 refills | Status: AC

## 2022-04-02 MED ORDER — ONDANSETRON HCL 4 MG PO TABS: 4.0000 mg | ORAL_TABLET | Freq: Four times a day (QID) | ORAL | 0 refills | Status: AC

## 2022-04-02 MED ORDER — FAMOTIDINE IN NACL 20-0.9 MG/50ML-% IV SOLN
20.0000 mg | Freq: Once | INTRAVENOUS | Status: AC
  Administered 2022-04-02: 20 mg via INTRAVENOUS
  Filled 2022-04-02: qty 50

## 2022-04-02 MED ORDER — ONDANSETRON HCL 4 MG/2ML IJ SOLN
4.0000 mg | Freq: Once | INTRAMUSCULAR | Status: AC
  Administered 2022-04-02: 4 mg via INTRAVENOUS
  Filled 2022-04-02: qty 2

## 2022-04-02 MED ORDER — DICYCLOMINE HCL 20 MG PO TABS: 20.0000 mg | ORAL_TABLET | Freq: Two times a day (BID) | ORAL | 0 refills | Status: AC | PRN

## 2022-04-02 NOTE — ED Triage Notes (Signed)
Pt with abd pain off and on x 2 weeks as well as diarrhea.  Emesis started at 3 this morning. States unable to see her PCP til October. C/o generalized weakness.

## 2022-04-02 NOTE — ED Provider Notes (Signed)
   Patient signed out to me by Benedetto Goad, PA-C for review of patient's urinalysis results.  Patient presented to the emergency department for evaluation of intermittent abdominal pain has been associated with nausea and vomiting diarrhea She does endorse history of atypical UTI symptoms in the past and notes having intermittent blood in her urine 2 weeks ago  On my exam, patient does have some mild suprapubic tenderness, no CVA tenderness   Her urinalysis shows moderate blood with proteinuria, 6-10 white cells and rare bacteria.  Urine culture is pending.  Patient is concerned that she has a UTI, will start on cephalexin and urine cultures pending.  Labs Reviewed  COMPREHENSIVE METABOLIC PANEL - Abnormal; Notable for the following components:      Result Value   Glucose, Bld 162 (*)    All other components within normal limits  CBC - Abnormal; Notable for the following components:   RBC 5.27 (*)    Hemoglobin 17.4 (*)    HCT 51.4 (*)    All other components within normal limits  URINALYSIS, ROUTINE W REFLEX MICROSCOPIC - Abnormal; Notable for the following components:   APPearance HAZY (*)    Glucose, UA 50 (*)    Hgb urine dipstick MODERATE (*)    Ketones, ur 5 (*)    Protein, ur 100 (*)    RBC / HPF >50 (*)    Bacteria, UA RARE (*)    All other components within normal limits  URINE CULTURE  LIPASE, BLOOD      Kem Parkinson, PA-C 04/02/22 2010    Godfrey Pick, MD 04/03/22 [phone]

## 2022-04-02 NOTE — ED Provider Notes (Signed)
[facility] EMERGENCY DEPARTMENT Provider Note   CSN: [id number] Arrival date & time: 04/02/22  1325     History  Chief Complaint  Patient presents with   Abdominal Pain    Whitney Luna is a 56 y.o. female.  Whitney Luna is a 56 y.o. female who is otherwise healthy, presents to the emergency department for evaluation of vomiting diarrhea.  Patient reports for the past 2 weeks she has had intermittent abdominal pain described as generalized cramping, when this abdominal pain started she has an episode of diarrhea and this seems to relieve pain.  She reports that this has occurred almost daily.  She reports that today at 3 AM she woke up with persistent vomiting, no hematemesis.  Has continued to have intermittent abdominal cramping and diarrhea in addition to this.  No fevers or chills.  No persistent abdominal pain.  Patient does report concern for history of frequent UTIs, and reports with these she does not have typical symptoms, currently denies any dysuria, hematuria or flank pain.  No other aggravating or alleviating factors.  The history is provided by the patient and medical records.  Abdominal Pain Associated symptoms: diarrhea, fatigue, nausea and vomiting   Associated symptoms: no chest pain, no chills, no cough, no dysuria, no fever and no shortness of breath        Home Medications Prior to Admission medications   Medication Sig Start Date End Date Taking? Authorizing Provider  dicyclomine (BENTYL) 20 MG tablet Take 1 tablet (20 mg total) by mouth 2 (two) times daily as needed for spasms (Abdominal pain). 04/02/22  Yes Jacqlyn Larsen, PA-C  famotidine (PEPCID) 20 MG tablet Take 1 tablet (20 mg total) by mouth 2 (two) times daily. 04/02/22  Yes Jacqlyn Larsen, PA-C  ondansetron (ZOFRAN) 4 MG tablet Take 1 tablet (4 mg total) by mouth every 6 (six) hours. 04/02/22  Yes Jacqlyn Larsen, PA-C  citalopram (CELEXA) 20 MG tablet Take 1 tablet (20 mg total) by mouth daily.  06/24/16   Terald Sleeper, PA-C  meloxicam (MOBIC) 7.5 MG tablet Take 7.5 mg by mouth daily. 08/16/19   [provider]  ondansetron (ZOFRAN ODT) 4 MG disintegrating tablet Take 1 tablet (4 mg total) by mouth every 8 (eight) hours as needed for nausea or vomiting. 11/19/19   Corena Herter, PA-C      Allergies    Patient has no known allergies.    Review of Systems   Review of Systems  Constitutional:  Positive for fatigue. Negative for chills and fever.  HENT: Negative.    Respiratory:  Negative for cough and shortness of breath.   Cardiovascular:  Negative for chest pain.  Gastrointestinal:  Positive for abdominal pain, diarrhea, nausea and vomiting.  Genitourinary:  Negative for dysuria and frequency.  All other systems reviewed and are negative.   Physical Exam Updated Vital Signs BP (!) 162/72   Pulse 74   Temp 99.1 F (37.3 C) (Oral)   Resp 15   Ht '5\' 2"'$  (1.575 m)   Wt 55.8 kg   SpO2 98%   BMI 22.50 kg/m  Physical Exam Vitals and nursing note reviewed.  Constitutional:      General: She is not in acute distress.    Appearance: Normal appearance. She is well-developed. She is not ill-appearing or diaphoretic.  HENT:     Head: Normocephalic and atraumatic.  Eyes:     General:  Right eye: No discharge.        Left eye: No discharge.     Pupils: Pupils are equal, round, and reactive to light.  Cardiovascular:     Rate and Rhythm: Normal rate and regular rhythm.     Pulses: Normal pulses.     Heart sounds: Normal heart sounds.  Pulmonary:     Effort: Pulmonary effort is normal. No respiratory distress.     Breath sounds: Normal breath sounds. No wheezing or rales.     Comments: Respirations equal and unlabored, patient able to speak in full sentences, lungs clear to auscultation bilaterally  Abdominal:     General: Bowel sounds are normal. There is no distension.     Palpations: Abdomen is soft. There is no mass.     Tenderness: There is no  abdominal tenderness. There is no guarding.     Comments: Abdomen soft, nondistended, nontender to palpation in all quadrants without guarding or peritoneal signs  Musculoskeletal:        General: No deformity.     Cervical back: Neck supple.  Skin:    General: Skin is warm and dry.     Capillary Refill: Capillary refill takes less than 2 seconds.  Neurological:     Mental Status: She is alert and oriented to person, place, and time.     Coordination: Coordination normal.     Comments: Speech is clear, able to follow commands Moves extremities without ataxia, coordination intact  Psychiatric:        Mood and Affect: Mood normal.        Behavior: Behavior normal.     ED Results / Procedures / Treatments   Labs (all labs ordered are listed, but only abnormal results are displayed) Labs Reviewed  COMPREHENSIVE METABOLIC PANEL - Abnormal; Notable for the following components:      Result Value   Glucose, Bld 162 (*)    All other components within normal limits  CBC - Abnormal; Notable for the following components:   RBC 5.27 (*)    Hemoglobin 17.4 (*)    HCT 51.4 (*)    All other components within normal limits  LIPASE, BLOOD  URINALYSIS, ROUTINE W REFLEX MICROSCOPIC    EKG None  Radiology No results found.  Procedures Procedures    Medications Ordered in ED Medications  sodium chloride 0.9 % bolus 1,000 mL (0 mLs Intravenous Stopped 04/02/22 1805)  ondansetron (ZOFRAN) injection 4 mg (4 mg Intravenous Given 04/02/22 1742)  famotidine (PEPCID) IVPB 20 mg premix (0 mg Intravenous Stopped 04/02/22 1812)    ED Course/ Medical Decision Making/ A&P                           Medical Decision Making Amount and/or Complexity of Data Reviewed Labs: ordered.  Risk Prescription drug management.   56 y.o. female presents to the ED with complaints of nausea, vomiting, diarrhea and intermittent abdominal pain, this involves an extensive number of treatment options, and is a  complaint that carries with it a high risk of complications and morbidity.  The differential diagnosis includes gastroenteritis, gastritis, colitis, IBS, IBD, considered but given reassuring abdominal exam feel cholecystitis, pancreatitis, appendicitis, diverticulitis less likely  On arrival pt is nontoxic, vitals significant for mild hypertension but otherwise reassuring.   Previous records obtained and reviewed   I ordered medication including IV fluid bolus, Zofran and Pepcid for vomiting  Lab Tests:  I Ordered, reviewed,  and interpreted labs, which included: No leukocytosis, elevated hemoglobin likely in the setting of hemoconcentration, no significant electrolyte derangements, normal renal and liver function and normal lipase, UA pending  Imaging Studies ordered:  Considered abdominal imaging but given reassuring exam without tenderness do not feel it would change management at this time.  ED Course:   Discussed reassuring lab results with patient at bedside.  After symptomatic treatment patient reports feeling much better she is now tolerating p.o. fluids.  At shift change patient's urinalysis is pending, regardless of results anticipate discharge home with continued symptomatic treatment for symptoms and PCP follow-up, but PA Tammy Triplett will follow-up on urinalysis and if it shows signs of infection will add appropriate antibiotic.   Portions of this note were generated with [employer]. Dictation errors may occur despite best attempts at proofreading.         Final Clinical Impression(s) / ED Diagnoses Final diagnoses:  Nausea vomiting and diarrhea    Rx / DC Orders ED Discharge Orders          Ordered    ondansetron (ZOFRAN) 4 MG tablet  Every 6 hours        04/02/22 1909    famotidine (PEPCID) 20 MG tablet  2 times daily        04/02/22 1909    dicyclomine (BENTYL) 20 MG tablet  2 times daily PRN        04/02/22 1909              Jacqlyn Larsen, PA-C 04/02/22 1918    Godfrey Pick, MD 04/03/22 [phone]

## 2022-04-02 NOTE — Discharge Instructions (Addendum)
Your lab work today was normal reassuring so glad you are feeling better.  You can use prescribed Zofran as needed for nausea and vomiting.  Take Pepcid twice daily to help with burning pain and stomach irritation.  You can use Bentyl to help with stomach cramping associated with diarrhea.  Follow food recommendations to help relieve diarrhea and you can also take a probiotic daily to help with this.  Also, take the antibiotic as directed until it is finished.  follow-up with your primary care doctor if you continue to have bouts of diarrhea.  Return to the ED if you have persistent vomiting, new or worsening abdominal pain, fevers or other new or concerning symptoms.

## 2022-04-04 LAB — URINE CULTURE: Culture: NO GROWTH

## 2022-05-13 ENCOUNTER — Encounter (INDEPENDENT_AMBULATORY_CARE_PROVIDER_SITE_OTHER): Payer: Self-pay | Admitting: *Deleted

## 2022-08-13 ENCOUNTER — Other Ambulatory Visit (HOSPITAL_COMMUNITY): Payer: Self-pay | Admitting: Sports Medicine

## 2022-08-13 DIAGNOSIS — Z1231 Encounter for screening mammogram for malignant neoplasm of breast: Secondary | ICD-10-CM

## 2022-10-06 ENCOUNTER — Ambulatory Visit: Payer: BC Managed Care – PPO | Admitting: Urology

## 2022-10-06 VITALS — BP 129/81 | HR 106 | Ht 62.0 in | Wt 107.0 lb

## 2022-10-06 DIAGNOSIS — N200 Calculus of kidney: Secondary | ICD-10-CM | POA: Diagnosis not present

## 2022-10-06 DIAGNOSIS — R8271 Bacteriuria: Secondary | ICD-10-CM | POA: Diagnosis not present

## 2022-10-06 DIAGNOSIS — N281 Cyst of kidney, acquired: Secondary | ICD-10-CM

## 2022-10-06 NOTE — Progress Notes (Unsigned)
10/06/2022 2:09 PM   Whitney Luna June 08, 1966 [id number]  Referring provider: Leonie Douglas, MD [address] [place],  [place] [id number]  No chief complaint on file.   HPI:  New pt -   1) bacteruria - malaise in Oct 2023. No bladder  symptoms - took abx.  Cx no growth. She noted urine color change and odor. Now she gets dysuria. She took abx. She is taking culturelle and cranberry extract daily. No NG risk. No GU surgery. BTL. No constipation.   2) gross hematuria - She has noticed red urine on occasion. She's lost 30 lbs over 4 months. She smokes 1/2 ppd.   3) Renal cyst, stone -she underwent a CT scan of the abdomen and pelvis in 2018 which revealed an 18 mm right upper pole cyst and a 5 mm left lower pole stone.  Today, seen for the above.   She retired from [employer], [employer].   PMH: No past medical history on file.  Surgical History: Past Surgical History:  Procedure Laterality Date   ORTHOPEDIC SURGERY      Home Medications:  Allergies as of 10/06/2022   No Known Allergies      Medication List        Accurate as of October 06, 2022  2:09 PM. If you have any questions, ask your nurse or doctor.          cephALEXin 500 MG capsule Commonly known as: KEFLEX Take 1 capsule (500 mg total) by mouth 4 (four) times daily.   citalopram 20 MG tablet Commonly known as: CELEXA Take 1 tablet (20 mg total) by mouth daily.   dicyclomine 20 MG tablet Commonly known as: BENTYL Take 1 tablet (20 mg total) by mouth 2 (two) times daily as needed for spasms (Abdominal pain).   famotidine 20 MG tablet Commonly known as: PEPCID Take 1 tablet (20 mg total) by mouth 2 (two) times daily.   meloxicam 7.5 MG tablet Commonly known as: MOBIC Take 7.5 mg by mouth daily.   ondansetron 4 MG disintegrating tablet Commonly known as: Zofran ODT Take 1 tablet (4 mg total) by mouth every 8 (eight) hours as needed for nausea or vomiting.   ondansetron 4 MG  tablet Commonly known as: ZOFRAN Take 1 tablet (4 mg total) by mouth every 6 (six) hours.        Allergies: No Known Allergies  Family History: No family history on file.  Social History:  reports that she has been smoking cigarettes. She has been smoking an average of .5 packs per day. She has never used smokeless tobacco. She reports current alcohol use. She reports current drug use. Drug: Marijuana.   Physical Exam: BP 129/81   Pulse (!) 106   Ht 5\' 2"  (1.575 m)   Wt 107 lb (48.5 kg)   BMI 19.57 kg/m   Constitutional:  Alert and oriented, No acute distress. HEENT: [place] AT, moist mucus membranes.  Trachea midline, no masses. Cardiovascular: No clubbing, cyanosis, or edema. Respiratory: Normal respiratory effort, no increased work of breathing. GI: Abdomen is soft, nontender, nondistended, no abdominal masses GU: No CVA tenderness Skin: No rashes, bruises or suspicious lesions. Neurologic: Grossly intact, no focal deficits, moving all 4 extremities. Psychiatric: Normal mood and affect.  Laboratory Data: Lab Results  Component Value Date   WBC 10.2 04/02/2022   HGB 17.4 (H) 04/02/2022   HCT 51.4 (H) 04/02/2022   MCV 97.5 04/02/2022   PLT 280 04/02/2022  Lab Results  Component Value Date   CREATININE 0.62 04/02/2022     Urinalysis    Component Value Date/Time   COLORURINE YELLOW 04/02/2022 1914   APPEARANCEUR HAZY (A) 04/02/2022 1914   LABSPEC 1.019 04/02/2022 1914   PHURINE 7.0 04/02/2022 1914   GLUCOSEU 50 (A) 04/02/2022 1914   HGBUR MODERATE (A) 04/02/2022 1914   BILIRUBINUR NEGATIVE 04/02/2022 1914   KETONESUR 5 (A) 04/02/2022 1914   PROTEINUR 100 (A) 04/02/2022 1914   UROBILINOGEN 0.2 12/26/2008 1822   NITRITE NEGATIVE 04/02/2022 1914   LEUKOCYTESUR NEGATIVE 04/02/2022 1914    Lab Results  Component Value Date   BACTERIA RARE (A) 04/02/2022    Pertinent Imaging:  Results for orders placed during the hospital encounter of 02/20/17  CT  HEMATURIA WORKUP  Narrative CLINICAL DATA:  Intermittent hematuria, painless x4 months  EXAM: CT ABDOMEN AND PELVIS WITHOUT AND WITH CONTRAST  TECHNIQUE: Multidetector CT imaging of the abdomen and pelvis was performed following the standard protocol before and following the bolus administration of intravenous contrast.  CONTRAST:  131mL ISOVUE-300 IOPAMIDOL (ISOVUE-300) INJECTION 61%  COMPARISON:  None.  FINDINGS: Lower chest: Lung bases are clear.  Hepatobiliary: 8.7 cm cavernous hemangioma in the lateral segment left hepatic lobe (series 4/image 10).  Gallbladder is unremarkable. No intrahepatic or extrahepatic ductal dilatation.  Pancreas: Within normal limits.  Spleen: Within normal limits.  Adrenals/Urinary Tract: Adrenal glands are within normal limits.  1.8 cm lateral right upper pole renal cyst (series 4/ image 17). No enhancing renal lesions.  5 mm nonobstructing left lower pole renal calculus (series 3/ image 26). No ureteral or bladder calculi. No hydronephrosis.  Mildly thick-walled bladder.  Stomach/Bowel: Stomach is within normal limits.  No evidence of bowel obstruction.  Normal appendix (series 4/ image 60).  Vascular/Lymphatic: No evidence of abdominal aortic aneurysm.  Mild atherosclerotic calcifications the abdominal aorta.  No suspicious abdominopelvic lymphadenopathy.  Reproductive: Uterus is within normal limits.  No adnexal masses.  Other: No abdominopelvic ascites.  Musculoskeletal: Visualized osseous structures are within normal limits.  IMPRESSION: 5 mm nonobstructing left lower pole renal calculus. No ureteral or bladder calculi. No hydronephrosis.  1.8 cm right lateral right upper pole renal cyst, benign (Bosniak I). No enhancing renal lesions.  8.7 cm cavernous hemangioma in the lateral segment left hepatic lobe, benign.   Electronically Signed By: Julian Hy M.D. On: 02/20/2017 15:38  No results found  for this or any previous visit.   Assessment & Plan:    1) UTI - cont probiotics and can try d-mannose  2) Gross hematuria - eval with CT and cystoscopy which eval for UTI and hematuria.   3) Renal cyst, renal stone - as above.   No follow-ups on file.  Festus Aloe, MD  [facility]  [address] [place], [place] [address] [phone]

## 2022-10-07 LAB — URINALYSIS, ROUTINE W REFLEX MICROSCOPIC
Bilirubin, UA: NEGATIVE
Glucose, UA: NEGATIVE
Ketones, UA: NEGATIVE
Nitrite, UA: NEGATIVE
Specific Gravity, UA: 1.025 (ref 1.005–1.030)
Urobilinogen, Ur: 1 mg/dL (ref 0.2–1.0)
pH, UA: 6 (ref 5.0–7.5)

## 2022-10-07 LAB — MICROSCOPIC EXAMINATION: WBC, UA: >30 /hpf — AB (ref 0–5)

## 2022-10-08 LAB — URINE CULTURE

## 2022-10-10 ENCOUNTER — Telehealth: Payer: Self-pay

## 2022-10-10 NOTE — Telephone Encounter (Signed)
Called pt with no answer.  Left voicemail to return call on Monday 03/25 for results.

## 2022-10-10 NOTE — Telephone Encounter (Signed)
-----   Message from Festus Aloe, MD sent at 10/08/2022  4:30 PM EDT ----- Looks OK - she does not need antibiotic treatment at this time.   ----- Message ----- From: Audie Box, CMA Sent: 10/08/2022   3:56 PM EDT To: Festus Aloe, MD  Please review, no treatment started.

## 2022-10-20 ENCOUNTER — Encounter (INDEPENDENT_AMBULATORY_CARE_PROVIDER_SITE_OTHER): Payer: Self-pay | Admitting: *Deleted

## 2022-11-05 ENCOUNTER — Ambulatory Visit (HOSPITAL_COMMUNITY)
Admission: RE | Admit: 2022-11-05 | Discharge: 2022-11-05 | Disposition: A | Discharge status: Home / Self Care | Payer: BC Managed Care – PPO | Source: Ambulatory Visit | Attending: Urology | Admitting: Urology

## 2022-11-05 DIAGNOSIS — N281 Cyst of kidney, acquired: Secondary | ICD-10-CM

## 2022-11-05 DIAGNOSIS — N200 Calculus of kidney: Secondary | ICD-10-CM

## 2022-11-05 DIAGNOSIS — R8271 Bacteriuria: Secondary | ICD-10-CM

## 2022-11-05 MED ORDER — IOHEXOL 300 MG/ML  SOLN
100.0000 mL | Freq: Once | INTRAMUSCULAR | Status: AC | PRN
  Administered 2022-11-05: 100 mL via INTRAVENOUS

## 2022-11-05 MED ORDER — SODIUM CHLORIDE 0.9 % IV SOLN
INTRAVENOUS | Status: AC
  Filled 2022-11-05: qty 250

## 2022-11-05 MED ORDER — SODIUM CHLORIDE (PF) 0.9 % IJ SOLN
INTRAMUSCULAR | Status: AC
  Filled 2022-11-05: qty 50

## 2022-11-13 ENCOUNTER — Telehealth: Payer: Self-pay

## 2022-11-13 NOTE — Telephone Encounter (Signed)
Patient called requesting CT results, scheduled for a cysto on 05/20

## 2022-11-17 ENCOUNTER — Other Ambulatory Visit: Payer: Self-pay

## 2022-11-17 DIAGNOSIS — N200 Calculus of kidney: Secondary | ICD-10-CM

## 2022-11-17 NOTE — Telephone Encounter (Signed)
Patient aware of MD response, KUB ordered.  Patient will have KUB sometime next week and keep scheduled apt on 05/20.

## 2022-12-08 ENCOUNTER — Other Ambulatory Visit: Payer: BC Managed Care – PPO | Admitting: Urology

## 2024-04-26 ENCOUNTER — Encounter (INDEPENDENT_AMBULATORY_CARE_PROVIDER_SITE_OTHER): Payer: Self-pay | Admitting: *Deleted

## 2024-05-23 ENCOUNTER — Encounter: Payer: Self-pay | Admitting: Radiology

## 2024-06-15 ENCOUNTER — Other Ambulatory Visit (INDEPENDENT_AMBULATORY_CARE_PROVIDER_SITE_OTHER): Payer: Self-pay

## 2024-06-15 ENCOUNTER — Ambulatory Visit: Payer: Self-pay | Admitting: Orthopedic Surgery

## 2024-06-15 VITALS — Ht 62.0 in | Wt 125.0 lb

## 2024-06-15 DIAGNOSIS — M545 Low back pain, unspecified: Secondary | ICD-10-CM

## 2024-06-15 DIAGNOSIS — Z720 Tobacco use: Secondary | ICD-10-CM
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
